# Patient Record
Sex: Female | Born: 2000 | Race: White | Hispanic: No | Marital: Single | Smoking: Never smoker
Health system: Southern US, Community
[De-identification: ages and names within clinical notes are randomized; demographics above are authoritative.]

## PROBLEM LIST (undated history)

## (undated) DIAGNOSIS — Z8669 Personal history of other diseases of the nervous system and sense organs: Secondary | ICD-10-CM

## (undated) DIAGNOSIS — F419 Anxiety disorder, unspecified: Secondary | ICD-10-CM

## (undated) DIAGNOSIS — Z9109 Other allergy status, other than to drugs and biological substances: Secondary | ICD-10-CM

## (undated) HISTORY — PX: OTHER SURGICAL HISTORY: SHX169

## (undated) HISTORY — PX: PANCREATECTOMY: SHX5261

## (undated) HISTORY — DX: Anxiety disorder, unspecified: F41.9

## (undated) HISTORY — DX: Personal history of other diseases of the nervous system and sense organs: Z86.69

---
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
## 2022-01-08 ENCOUNTER — Ambulatory Visit (INDEPENDENT_AMBULATORY_CARE_PROVIDER_SITE_OTHER): Payer: BC Managed Care – PPO

## 2022-01-08 ENCOUNTER — Ambulatory Visit (INDEPENDENT_AMBULATORY_CARE_PROVIDER_SITE_OTHER): Payer: BC Managed Care – PPO | Admitting: Cardiology

## 2022-01-08 ENCOUNTER — Other Ambulatory Visit: Payer: Self-pay

## 2022-01-08 ENCOUNTER — Encounter: Payer: Self-pay | Admitting: Cardiology

## 2022-01-08 VITALS — BP 98/67 | HR 61 | Ht 61.0 in | Wt 126.0 lb

## 2022-01-08 DIAGNOSIS — R55 Syncope and collapse: Secondary | ICD-10-CM

## 2022-01-08 DIAGNOSIS — I951 Orthostatic hypotension: Secondary | ICD-10-CM

## 2022-01-08 NOTE — Patient Instructions (Signed)
Medication Instructions:  ? ?Your physician recommends that you continue on your current medications as directed. Please refer to the Current Medication list given to you today.  ? ?*If you need a refill on your cardiac medications before your next appointment, please call your pharmacy* ? ? ?Lab Work: ?None ordered ? ?If you have labs (blood work) drawn today and your tests are completely normal, you will receive your results only by: ?MyChart Message (if you have MyChart) OR ?A paper copy in the mail ?If you have any lab test that is abnormal or we need to change your treatment, we will call you to review the results. ? ? ?Testing/Procedures: ?Your provider has ordered a heart monitor to wear for 14 days. This will be mailed to your home with instructions on placement. Once you have finished the time frame requested, you will return monitor in box provided. ? ?  ? ? ?Follow-Up: ?At [facility], you and your health needs are our priority.  As part of our continuing mission to provide you with exceptional heart care, we have created designated Provider Care Teams.  These Care Teams include your primary Cardiologist (physician) and Advanced Practice Providers (APPs -  Physician Assistants and Nurse Practitioners) who all work together to provide you with the care you need, when you need it. ? ?We recommend signing up for the patient portal called "MyChart".  Sign up information is provided on this After Visit Summary.  MyChart is used to connect with patients for Virtual Visits (Telemedicine).  Patients are able to view lab/test results, encounter notes, upcoming appointments, etc.  Non-urgent messages can be sent to your provider as well.   ?To learn more about what you can do with MyChart, go to [email or web address].   ? ?Your next appointment:   ?4-6 week(s) ? ?The format for your next appointment:   ?In Person ? ?Provider:   ?You may see Bryan Lemma, MD or one of the following Advanced Practice Providers  on your designated Care Team:   ?Nicolasa Ducking, NP ?Eula Listen, PA-C ?Cadence Fransico Michael, PA-C ? ? ?Other Instructions ?- Please ensure that you are drinking plenty of fluids; aim for 3L a day, with a mix of water and drinks such as Gatorade, Poweraid, etc.  ?- Do core strengthening exercises such as a wall lean for 10 minutes  ?- Compression: ?Lower extremity (up to the knee) ?Thigh Sleeves ?Abdominal Binder ?Spanx ? ?- Salt Supplementation Options: ?1) Pedialyte Advanced Care ?2) Liquid IV ?3) Nuun ?4) Therma Tabs ?5) Vitassium ? ?- Exercise ? ?- Shower at Night ? ?

## 2022-01-08 NOTE — Progress Notes (Signed)
Primary Care Provider: Herb GraysBoylston, Yun, MD [facility] Cardiologist: None Electrophysiologist: None  Clinic Note: Chief Complaint  Patient presents with   New Patient (Initial Visit)    Establish care with provider and medications verbally reviewed with patient.    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  There also may be a component of POTS mainstay of treatment for this is the same.  She has baseline borderline hypotension.  However will check orthostatics today, was relatively normal.  However, if she is dehydrated, or not feeling well, with the start of blood pressure in the high 90s to low 100s, a drop of 10-15 points on standing could easily lead to syncope.  She is already had an echocardiogram done recently and it did not show any structural abnormalities.  No signs of MVP.  She does states she has tachycardia spells and therefore we will check a monitor to confirm or deny presence of any arrhythmias or significant tachycardia.  We will try to avoid AV nodal agents at this point.  Typically, SNRIs tend to be more beneficial in this setting than SSRIs which could potentiate this type of syncope.  She has been switched to Lexapro which should help.  However, we may consider holding if she has frequent episodes.  Plan: Conservative management for now: - Please ensure that you are drinking plenty of fluids; aim for 3L a day, with a mix of water and drinks such as Gatorade, Poweraid, etc.  - Do core strengthening exercises such as a wall lean for 10 minutes  (along with Wall-lean exercise with half squats.) - Compression: Lower extremity (up to the knee) Thigh Sleeves Abdominal Binder Spanx  - Salt Supplementation  Options: 1) Pedialyte Advanced Care 2) Liquid IV 3) Nuun 4) Therma Tabs 5) Vitassium  - Exercise-discussed exercise tense abdominal, buttock, thigh and calf muscles.    - Shower at [employer]      Relevant Orders   EKG 12-Lead (Completed)   LONG TERM MONITOR (3-14 DAYS)    ===================================  HPI:    Lydia Jennings is a 21 y.o. female with q history of syncope and panic attacks (questionable POTS) who is being seen today for the evaluation of recurrent syncope at the request of Herb GraysBoylston, Yun, MD -> in response to recent ER visits..  Recent Hospitalizations:  [facility] ED 12/15/2021: Presented with a syncopal episode at school.  Has been followed by neurology for syncope for some time now.  Appears to be evaluated with echocardiogram in September 2021 with normal findings.  EKG was normal.  Symptoms improved with hydration.  Suspect orthostatic etiology and possibly dehydration as a contributing factor. [facility] ED 01/01/2022: Syncopal episode at school after standing up quickly from tying shoes.  Thought to be orthostatic in nature.  No red flags.  Referred to cardiology for possible POTS-as diagnosed by PCP.  Reviewed  CV studies:    The following studies were reviewed today: (if available, images/films reviewed: From Epic Chart or Care Everywhere) TTE 08/02/2020: Normal right and left ventricles.  Normal valves.  No ASD or VSD   Interval History:   Lydia SeaBreighley Alyssa Klayman presents here for evaluation of recurrent syncope episodes.  Prior to these episodes, she had not had a documented  episode in over a year.  She has had significant positional dizziness and lightheadedness as well as tachycardia spells that are somewhat worrisome to her with at least one recording on her Apple Watch showing that her heart rate 170 bpm.  However she is only really had 2 true syncopal episodes-they occurred too quickly where she was not able to react fast enough to get down..  Otherwise  she just has episodes where she feels somewhat shaky and dizzy but is able to sit down. She has been working with the specialist for her anxiety which could be playing a component of her symptoms.  Initial episode: Per EMS report: She stood up while studying at [employer] at about the syncopal event with loss of consciousness of roughly 5 minutes.  No seizure-like activity.  No trauma.  Followed by hyperventilation after regaining consciousness.  The only symptoms leading up to this episode over the she had had some nausea and vomiting over the weekend and may have been little bit dehydrated.  She denied any sensation of rapid heart rates or palpitations with this episode.  No alcohol or drug use. No chest pain or pressure.No arrhythmias noted. => Suspected to be orthostatic/neurocardiogenic syncope related to dehydration.   => Hydrated and felt better.    Per primary care provider suspected possible POTS and panic attacks.  Second episode later that month: Was bending over to tie her shoes, stood up and passed out.  Exam benign.  Work-up benign.  Hydrated in the ER discharged home with cardiology follow-up.  Encouraged hydration and increased salt intake.   CV Review of Symptoms (Summary) Cardiovascular ROS: no chest pain or dyspnea on exertion positive for - loss of consciousness, palpitations, rapid heart rate, and mild pooling swelling of the feet.  Has had hyperventilation and panic attacks as well. negative for - chest pain, irregular heartbeat, orthopnea, paroxysmal nocturnal dyspnea, shortness of breath, or TIA/amaurosis fugax or claudication  REVIEWED OF SYSTEMS   Review of Systems  Constitutional:  Negative for malaise/fatigue and weight loss.  HENT:  Negative for congestion.   Respiratory:  Negative for cough and shortness of breath.   Cardiovascular:        Per HPI  Gastrointestinal:  Negative for blood in stool and melena.  Genitourinary:  Negative for hematuria.   Musculoskeletal:  Negative for falls (Only with 2 syncopal episodes.;  No trauma).  Neurological:  Positive for dizziness (She does have some positional dizziness, not nearly as severe as the 2 syncopal episodes.Marland Kitchen) and loss of consciousness (Per HPI).  Psychiatric/Behavioral:  The patient is nervous/anxious.    I have reviewed and (if needed) personally updated the patient's problem list, medications, allergies, past medical and surgical history, social and family history.   PAST MEDICAL HISTORY   Past Medical History:  Diagnosis Date   Anxiety    Environmental allergies    Hx of migraines    Recurrent syncope 07/2020   Evaluated, had an echocardiogram.  Did not do tilt table    PAST SURGICAL HISTORY   Past Surgical History:  Procedure Laterality Date   Pancreatic debridement     TRANSTHORACIC ECHOCARDIOGRAM  08/02/2020   Normal right and left ventricles.  Normal valves.  No ASD or VSD    There is no immunization history on file for this patient.  MEDICATIONS/ALLERGIES   Current Meds  Medication Sig   Azelastine-Fluticasone 137-50 MCG/ACT SUSP Place into the nose.   cetirizine (ZYRTEC) 10 MG tablet Take by mouth.  Cholecalciferol 25 MCG (1000 UT) tablet Take by mouth.   desogestrel-ethinyl estradiol (MIRCETTE) 0.15-0.02/0.01 MG (21/5) tablet Take 1 tablet by mouth daily.   EPINEPHrine 0.3 mg/0.3 mL IJ SOAJ injection Inject into the muscle as directed.   escitalopram (LEXAPRO) 10 MG tablet Take 10 mg by mouth daily.   RIBOFLAVIN PO Take by mouth.   rizatriptan (MAXALT) 10 MG tablet Take by mouth.    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   Hopes to go into premed.  Her father is an Risk manager in [place]   Usually relatively active..   Family History  Problem Relation Age of Onset   Migraines Mother    Brain cancer Maternal Grandmother    Cancer Paternal Grandmother     OBJCTIVE -PE, EKG, labs   Wt Readings from Last 3 Encounters:  01/08/22 126 lb (57.2 kg) (50 %, Z= 0.01)*  03/11/16 119 lb 12.8 oz (54.3 kg) (79 %, Z= 0.81)*  03/10/16 120 lb 9.6 oz (54.7 kg) (80 %, Z= 0.83)*   * Growth percentiles are based on CDC (Girls, 2-20 Years) data.    Physical Exam: BP 98/67 (BP Location: Right Arm, Patient Position: Sitting, Cuff Size: Normal)    Pulse 61    Ht 5\' 1"  (1.549 m)    Wt 126 lb (57.2 kg)    SpO2 97%    BMI 23.81 kg/m  Physical Exam Vitals reviewed.  Constitutional:      General: She is not in acute distress.    Appearance: Normal appearance. She is normal weight. She is not ill-appearing or toxic-appearing.  HENT:     Head: Normocephalic and atraumatic.  Neck:     Vascular: No carotid bruit.  Cardiovascular:     Rate and Rhythm: Normal rate and regular rhythm.     Pulses: Normal pulses.     Heart sounds: No murmur heard.   No friction rub. No gallop.  Pulmonary:     Effort: Pulmonary effort is normal. No respiratory distress.     Breath sounds: Normal breath sounds. No wheezing, rhonchi or rales.  Chest:     Chest wall: No tenderness.  Abdominal:     General: Abdomen is flat. Bowel sounds are normal. There is no distension.     Palpations: Abdomen is soft. There is no mass.     Tenderness: There is no abdominal tenderness.     Comments: No HSM or bruit.  Musculoskeletal:        General: No swelling. Normal range of motion.     Cervical back: Normal range of motion and neck supple.  Skin:    General: Skin is warm and dry.     Coloration: Skin is not pale.  Neurological:     General: No focal deficit present.     Mental Status: She is alert and oriented to person, place, and time.     Cranial Nerves: No cranial nerve deficit.     Motor:  No weakness.     Gait: Gait normal.  Psychiatric:        Mood and Affect: Mood normal.        Behavior:  Behavior normal.        Thought Content: Thought content normal.        Judgment: Judgment normal.     Adult ECG Report  Rate: 61 ;  Rhythm: NSR.  Baseline wander but otherwise normal axis vitals durations.;   Narrative Interpretation: Stable  Recent Labs: Reviewed No results found for: CHOL, HDL, LDLCALC, LDLDIRECT, TRIG, CHOLHDL Lab Results  Component Value Date   CREATININE 0.64 03/11/2016   BUN 12 03/11/2016   NA 137 03/11/2016   K 3.7 03/11/2016   CL 102 03/11/2016   CO2 26 03/11/2016   CBC Latest Ref Rng & Units 03/11/2016 08/02/2011  WBC 3.6 - 11.0 K/uL 8.3 21.1(H)  Hemoglobin 12.0 - 16.0 g/dL 65.7 90.3  Hematocrit 83.3 - 45.0 % 37.5 37.3  Platelets 150 - 440 K/uL 301 349    No results found for: HGBA1C No results found for: TSH  ==================================================  COVID-19 Education: The signs and symptoms of COVID-19 were discussed with the patient and how to seek care for testing (follow up with PCP or arrange E-visit).    I spent a total of 24 minutes with the patient spent in direct patient consultation.  Additional time spent with chart review  / charting (studies, outside notes, etc): 22 min Total Time: 46 min  Current medicines are reviewed at length with the patient today.  (+/- concerns) n/a  This visit occurred during the SARS-CoV-2 public health emergency.  Safety protocols were in place, including screening questions prior to the visit, additional usage of staff PPE, and extensive cleaning of exam room while observing appropriate contact time as indicated for disinfecting solutions.  Notice: This dictation was prepared with Dragon dictation along with smart phrase technology. Any transcriptional errors that result from this process are unintentional and may not be corrected upon review.   Studies Ordered:  Orders Placed This  Encounter  Procedures   LONG TERM MONITOR (3-14 DAYS)   EKG 12-Lead    Patient Instructions / Medication Changes & Studies & Tests Ordered   Patient Instructions  Medication Instructions:   Your physician recommends that you continue on your current medications as directed. Please refer to the Current Medication list given to you today.   *If you need a refill on your cardiac medications before your next appointment, please call your pharmacy*   Lab Work: None ordered  If you have labs (blood work) drawn today and your tests are completely normal, you will receive your results only by: MyChart Message (if you have MyChart) OR A paper copy in the mail If you have any lab test that is abnormal or we need to change your treatment, we will call you to review the results.   Testing/Procedures: Your provider has ordered a Zio patch heart monitor to wear for 14 days. This will be mailed to your home with instructions on placement. Once you have finished the time frame requested, you will return monitor in box provided.     Follow-Up: At [facility], you and your health needs are our priority.  As part of our continuing mission to provide you with exceptional heart care, we have created designated Provider Care Teams.  These Care Teams include your primary Cardiologist (physician) and Advanced Practice Providers (APPs -  Physician Assistants and Nurse Practitioners) who all work together to provide you with the care you need, when you need it.  We recommend signing up for the patient portal called "MyChart".  Sign up information is  provided on this After Visit Summary.  MyChart is used to connect with patients for Virtual Visits (Telemedicine).  Patients are able to view lab/test results, encounter notes, upcoming appointments, etc.  Non-urgent messages can be sent to your provider as well.   To learn more about what you can do with MyChart, go to [email or web address].    Your next  appointment:   4-6 week(s)  The format for your next appointment:   In Person  Provider:   You may see Bryan Lemmaavid Maecyn Panning, MD or one of the following Advanced Practice Providers on your designated Care Team:   Nicolasa Duckinghristopher Berge, NP Eula Listenyan Dunn, PA-C Cadence Fransico MichaelFurth, PA-C   Other Instructions - Please ensure that you are drinking plenty of fluids; aim for 3L a day, with a mix of water and drinks such as Gatorade, Poweraid, etc.  - Do core strengthening exercises such as a wall lean for 10 minutes  - Compression: Lower extremity (up to the knee) Thigh Sleeves Abdominal Binder Spanx  - Salt Supplementation Options: 1) Pedialyte Advanced Care 2) Liquid IV 3) Nuun 4) Therma Tabs 5) Vitassium  - Exercise  - Shower at Night     Bryan Lemmaavid Tarsha Blando, M.D., M.S. Interventional Cardiologist   Pager # [phone] Phone # [phone] [address] [place], [place] [address]   Thank you for choosing [facility] in [place]!!

## 2022-01-08 NOTE — Progress Notes (Deleted)
Arrhythmias (not life-threatening) -- Rhythm disturbances that are not ventricular in origin may cause syncope: ??Supraventricular tachycardia - Syncope is an unusual presentation of supraventricular tachycardia. Accessory pathway mediated tachycardia and atrioventricular nodal reentry tachycardia are relatively common arrhythmias, but rarely present with syncope. (See "Clinical features and diagnosis of supraventricular tachycardia (SVT) in children", section on 'Syncope and atrial fibrillation'.) ??Bradycardia - In children, syncope caused by isolated bradycardia is unusual [11]. Causes of symptomatic bradycardia in children are corrective surgery for congenital heart disease, hypervagotonia, hypothyroidism, and medications (such as beta-adrenergic blockers). (See "Bradycardia in children".) ?Postural tachycardia syndrome -- Postural tachycardia syndrome (POTS) is defined as a form of orthostatic intolerance characterized by an excessive increase in heart rate (>40 beats per minute [bpm] over baseline in children and adolescents and >30 bpm or to >120 bpm in adults) that occurs on standing without arterial hypotension. POTS is a common disorder among teenage girls that typically manifests as palpitations, anxiety, dizziness, and tremulousness. However, syncope may occur in up to 40 percent of patients. (See "Postural tachycardia syndrome".) ? ? ?

## 2022-01-10 ENCOUNTER — Encounter: Payer: Self-pay | Admitting: Cardiology

## 2022-01-10 DIAGNOSIS — I951 Orthostatic hypotension: Secondary | ICD-10-CM | POA: Insufficient documentation

## 2022-01-10 NOTE — Assessment & Plan Note (Addendum)
The symptoms described are clearly consistent with orthostatic hypotension.  There also may be a component of POTS mainstay of treatment for this is the same.  She has baseline borderline hypotension.  However will check orthostatics today, was relatively normal.  However, if she is dehydrated, or not feeling well, with the start of blood pressure in the high 90s to low 100s, a drop of 10-15 points on standing could easily lead to syncope. ? ?She is already had an echocardiogram done recently and it did not show any structural abnormalities.  No signs of MVP. ? ?She does states she has tachycardia spells and therefore we will check a monitor to confirm or deny presence of any arrhythmias or significant tachycardia.  We will try to avoid AV nodal agents at this point. ? ?? Typically, SNRIs tend to be more beneficial in this setting than SSRIs which could potentiate this type of syncope.  She has been switched to Lexapro which should help.  However, we may consider holding if she has frequent episodes. ? ?Plan: Conservative management for now: ?- Please ensure that you are drinking plenty of fluids; aim for 3L a day, with a mix of water and drinks such as Gatorade, Poweraid, etc.  ?- Do core strengthening exercises such as a wall lean for 10 minutes  (along with Wall-lean exercise with half squats.) ?- Compression: ?Lower extremity (up to the knee) ?Thigh Sleeves ?Abdominal Binder ?Spanx ? ?- Salt Supplementation Options: ?1) Pedialyte Advanced Care ?2) Liquid IV ?3) Nuun ?4) Therma Tabs ?5) Vitassium ? ?- Exercise-discussed exercise tense abdominal, buttock, thigh and calf muscles.   ? ?- Shower at Night ?

## 2022-01-13 DIAGNOSIS — R55 Syncope and collapse: Secondary | ICD-10-CM

## 2022-02-12 ENCOUNTER — Encounter: Payer: Self-pay | Admitting: Cardiology

## 2022-02-12 ENCOUNTER — Ambulatory Visit (INDEPENDENT_AMBULATORY_CARE_PROVIDER_SITE_OTHER): Payer: BC Managed Care – PPO | Admitting: Cardiology

## 2022-02-12 DIAGNOSIS — I951 Orthostatic hypotension: Secondary | ICD-10-CM

## 2022-02-12 MED ORDER — MIDODRINE HCL 2.5 MG PO TABS
2.5000 mg | ORAL_TABLET | Freq: Two times a day (BID) | ORAL | 3 refills | Status: DC
Start: 2022-02-12 — End: 2022-07-17

## 2022-02-12 NOTE — Progress Notes (Signed)
? ? ?Primary Care Provider: Herb GraysBoylston, Yun, MD ?Cardiologist: None ?Electrophysiologist: None ? ?Clinic Note: ?Chief Complaint  ?Patient presents with  ? Follow-up  ?  Discussed results of monitor  ? ?=================================== ? ?ASSESSMENT/PLAN  ? ?Problem List Items Addressed This Visit   ? ? 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  Baseline hypotension probably exacerbated by orthostatic hypotension.  There may or may not be a component of POTS.  Difficult because the true definition of POTS would mean that her resting heart rate is higher than an average of 72 bpm. ? ?Her symptoms however were noted with heart rates in the high 90s to low 100s.  She was asymptomatic with sinus tachycardia in the 130s to 170s. ? ?Plan: ?Continue to encourage aggressive hydration but also increased nutrition to ensure that hydration is fully absorbed. ?We will start low-dose midodrine to help baseline hypotension. ->  2.5 mg at least in the morning, and still dizzy in the afternoon, okay to take a second dose. ?  ?  ? ?=================================== ? ?HPI:   ? 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 ? ?Lydia Jennings was seen on January 08, 2022 for evaluation of episodic syncope.  Has had recurrent episodes leading up to the visit.  Prior to that had not had one in over a year.  Notes positional dizziness and lightheadedness as well as tachycardia spells.  1 occurrence her Apple Watch recorded a heart rate of 170 bpm at rest.  During the 2 episodes of syncope leading up to the visit she was not able to capture rhythm because of the have to fast.  She feels shaky and dizzy and usually is able to sit down before passing out, but the last 2 occasions it happened to pass. ? ?Recent  Hospitalizations: None ? ?Reviewed  CV studies:   ? ?The following studies were reviewed today: (if available, images/films reviewed: From Epic Chart or Care Everywhere) ?Zio Patch: Patient had a min HR of 50 bpm, max HR of 170 bpm, and avg HR of 72 bpm. Predominant underlying rhythm was Sinus Rhythm. Ectopic Atrial Rhythm was present. Ectopic Atrial Rhythm was detected within +/- 45 seconds of symptomatic patient event(s). Isolated SVEs were rare (<1.0%, 19), and no SVE Couplets or SVE Triplets were present. Isolated VEs were rare (<1.0%, 26), and no VE Couplets or VE Triplets were present. ? ?Interval History:  ? ?Lydia Jennings returns today this time with her mother.  She has still had tachycardic spells but has not had any further syncope.  She is also borderline hypotensive at baseline. ? ?Still has some fast heart rate spells.  We reviewed the results of her monitor indicating there were no arrhythmias.  Also very difficult to determine if this is true POTS because her resting heart rate averages 70 bpm. ? ?CV Review of Symptoms (Summary) ?Cardiovascular ROS: no chest pain or dyspnea on exertion ?positive for - palpitations, rapid heart rate, and near syncope, but no recurrent syncope ?negative for - edema, irregular heartbeat, orthopnea, paroxysmal nocturnal dyspnea, shortness of breath, or TIA/amaurosis fugax, claudication, ? ?REVIEWED OF SYSTEMS  ? ?Review of Systems  ?Constitutional:  Negative for malaise/fatigue and weight loss.  ?HENT:  Negative for congestion and nosebleeds.   ?Respiratory:  Negative for cough and shortness of breath.   ?  Cardiovascular:   ?     Per HPI  ?Neurological:  Positive for dizziness. Negative for focal weakness and weakness.  ?Psychiatric/Behavioral:  Negative for depression. The patient is nervous/anxious.   ? ?I have reviewed and (if needed) personally updated the patient's problem list, medications, allergies, past medical and surgical history, social and family  history.  ? ?PAST MEDICAL HISTORY  ? ?Past Medical History:  ?Diagnosis Date  ? Anxiety   ? Environmental allergies   ? Hx of migraines   ? Recurrent syncope 07/2020  ? Evaluated, had an echocardiogram.  Did not do tilt table  ? ? ?PAST SURGICAL HISTORY  ? ?Past Surgical History:  ?Procedure Laterality Date  ? Pancreatic debridement    ? TRANSTHORACIC ECHOCARDIOGRAM  08/02/2020  ? Normal right and left ventricles.  Normal valves.  No ASD or VSD  ? ? ?There is no immunization history on file for this patient. ? ?MEDICATIONS/ALLERGIES  ? ?Current Meds  ?Medication Sig  ? acetaminophen (TYLENOL) 500 MG tablet Take 500 mg by mouth as needed for headache.  ? Azelastine-Fluticasone 137-50 MCG/ACT SUSP Place into the nose.  ? cetirizine (ZYRTEC) 10 MG tablet Take by mouth.  ? Cholecalciferol 25 MCG (1000 UT) tablet Take by mouth.  ? desogestrel-ethinyl estradiol (MIRCETTE) 0.15-0.02/0.01 MG (21/5) tablet Take 1 tablet by mouth daily.  ? EPINEPHrine 0.3 mg/0.3 mL IJ SOAJ injection Inject into the muscle as directed.  ? escitalopram (LEXAPRO) 10 MG tablet Take 10 mg by mouth daily.  ? MAGNESIUM PO Take by mouth daily.  ? midodrine (PROAMATINE) 2.5 MG tablet Take 1 tablet (2.5 mg total) by mouth 2 (two) times daily with a meal.  ? Naproxen Sodium (ALEVE PO) Take by mouth as needed (headaches).  ? Omega-3 Fatty Acids (FISH OIL PO) Take by mouth daily.  ? RIBOFLAVIN PO Take by mouth.  ? rizatriptan (MAXALT) 10 MG tablet Take by mouth as needed for migraine.  ? ? ?Allergies  ?Allergen Reactions  ? Amoxicillin Hives  ? ? ?SOCIAL HISTORY/FAMILY HISTORY  ? ?Reviewed in Epic:  ?Pertinent findings:  ?Social History  ? ?Tobacco Use  ? Smoking status: Never  ?Substance Use Topics  ? Alcohol use: No  ? Drug use: No  ? ?Social History  ? ?Social History Narrative  ? 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  ? Hopes to go into premed.  Her father is an IT sales professional in [place]  ? Usually relatively active..  ? ? ?OBJCTIVE -PE,  EKG, labs  ? ?Wt Readings from Last 3 Encounters:  ?02/12/22 129 lb 12.8 oz (58.9 kg) (57 %, Z= 0.17)*  ?01/08/22 126 lb (57.2 kg) (50 %, Z= 0.01)*  ?03/11/16 119 lb 12.8 oz (54.3 kg) (79 %, Z= 0.81)*  ? ?* Growth percentiles are based on CDC (Girls, 2-20 Years) data.  ? ? ?Physical Exam: ?BP 96/82   Pulse 72   Ht 5\' 1"  (1.549 m)   Wt 129 lb 12.8 oz (58.9 kg)   LMP 01/27/2022   SpO2 99%   BMI 24.53 kg/m?  ?Physical Exam ?Vitals reviewed.  ?Constitutional:   ?   Appearance: Normal appearance. She is normal weight.  ?HENT:  ?   Head: Normocephalic and atraumatic.  ?Neck:  ?   Vascular: No carotid bruit.  ?Cardiovascular:  ?   Rate and Rhythm: Normal rate and regular rhythm.  ?   Pulses: Normal pulses.  ?   Heart sounds: Normal heart sounds. No murmur heard. ?  No friction rub. No gallop.  ?Pulmonary:  ?   Effort: Pulmonary effort is normal. No respiratory distress.  ?   Breath sounds: Normal breath sounds. No wheezing, rhonchi or rales.  ?Musculoskeletal:     ?   General: No swelling. Normal range of motion.  ?   Cervical back: Normal range of motion and neck supple.  ?Skin: ?   General: Skin is warm and dry.  ?Neurological:  ?   General: No focal deficit present.  ?   Mental Status: She is alert and oriented to person, place, and time.  ?Psychiatric:     ?   Mood and Affect: Mood normal.     ?   Behavior: Behavior normal.     ?   Thought Content: Thought content normal.     ?   Judgment: Judgment normal.  ? ? ? Adult ECG Report ?N/A ? ?Recent Labs:  N/A  ?No results found for: CHOL, HDL, LDLCALC, LDLDIRECT, TRIG, CHOLHDL ?Lab Results  ?Component Value Date  ? CREATININE 0.64 03/11/2016  ? BUN 12 03/11/2016  ? NA 137 03/11/2016  ? K 3.7 03/11/2016  ? CL 102 03/11/2016  ? CO2 26 03/11/2016  ? ? ?  Latest Ref Rng & Units 03/11/2016  ?  3:54 PM 08/02/2011  ?  6:55 PM  ?CBC  ?WBC 3.6 - 11.0 K/uL 8.3   21.1    ?Hemoglobin 12.0 - 16.0 g/dL 08.6   76.1    ?Hematocrit 35.0 - 45.0 % 37.5   37.3    ?Platelets 150 - 440 K/uL  301   349    ? ? ?No results found for: HGBA1C ?No results found for: TSH ? ?================================================== ? ?COVID-19 Education: ?The signs and symptoms of COVID-19 were discussed with the patient and h

## 2022-02-12 NOTE — Patient Instructions (Signed)
Medication Instructions:  ?Your physician has recommended you make the following change in your medication:  ? ?START midodrine (Proamatine) 2.5 mg 1-2 times daily  ? ?*If you need a refill on your cardiac medications before your next appointment, please call your pharmacy* ? ? ?Lab Work: ?None ordered ? ?If you have labs (blood work) drawn today and your tests are completely normal, you will receive your results only by: ?MyChart Message (if you have MyChart) OR ?A paper copy in the mail ?If you have any lab test that is abnormal or we need to change your treatment, we will call you to review the results. ? ? ?Testing/Procedures: ?None ordered ? ? ?Follow-Up: ?At [facility], you and your health needs are our priority.  As part of our continuing mission to provide you with exceptional heart care, we have created designated Provider Care Teams.  These Care Teams include your primary Cardiologist (physician) and Advanced Practice Providers (APPs -  Physician Assistants and Nurse Practitioners) who all work together to provide you with the care you need, when you need it. ? ?We recommend signing up for the patient portal called "MyChart".  Sign up information is provided on this After Visit Summary.  MyChart is used to connect with patients for Virtual Visits (Telemedicine).  Patients are able to view lab/test results, encounter notes, upcoming appointments, etc.  Non-urgent messages can be sent to your provider as well.   ?To learn more about what you can do with MyChart, go to [email or web address].   ? ?Your next appointment:   ?3 month(s) ? ?The format for your next appointment:   ?In Person ? ?Provider:   ?You may see Bryan Lemma, MD or one of the following Advanced Practice Providers on your designated Care Team:   ?Nicolasa Ducking, NP ?Eula Listen, PA-C ?Cadence Fransico Michael, PA-C ? ?Other Instructions ?N/A ?

## 2022-03-22 ENCOUNTER — Encounter: Payer: Self-pay | Admitting: Cardiology

## 2022-03-22 NOTE — Assessment & Plan Note (Signed)
Baseline hypotension probably exacerbated by orthostatic hypotension.  There may or may not be a component of POTS.  Difficult because the true definition of POTS would mean that her resting heart rate is higher than an average of 72 bpm. ? ?Her symptoms however were noted with heart rates in the high 90s to low 100s.  She was asymptomatic with sinus tachycardia in the 130s to 170s. ? ?Plan: ?? Continue to encourage aggressive hydration but also increased nutrition to ensure that hydration is fully absorbed. ?? We will start low-dose midodrine to help baseline hypotension. ->  2.5 mg at least in the morning, and still dizzy in the afternoon, okay to take a second dose. ?

## 2022-04-29 IMAGING — MR MR HEAD WO/W CM
14 series · 48 of 48 positions shown · IV contrast (5ml Gadavist)
Comparison: None.

CLINICAL DATA: 19-year-old female with chronic but progressive
headaches/migraines over the past 2 months. Now with dizziness and
light sensitivity. At least 2 episodes of near-syncope.

EXAM:
MRI HEAD WITHOUT AND WITH CONTRAST
TECHNIQUE: Multiplanar, multiecho pulse sequences of the brain and surrounding
structures were obtained without and with intravenous contrast.
CONTRAST:  5mL GADAVIST GADOBUTROL 1 MMOL/ML IV SOLN

[Series 5: ax dwi_tracew · axial · 3.0mm · 0.60mm/px · z∈[-143,-0]mm · 2 of 46 slices shown]
[im 1/46]
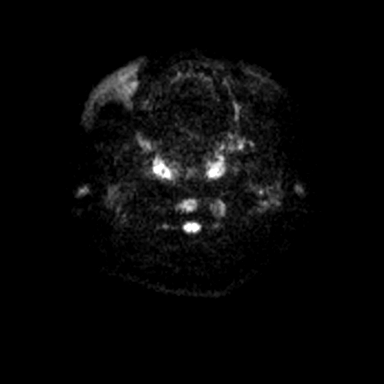
[im 46/46]
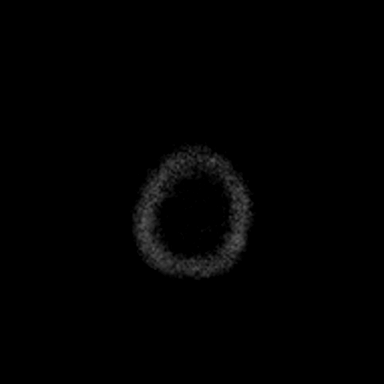

[Series 6: ax dwi_adc · axial · 3.0mm · 0.60mm/px · z∈[-143,-0]mm · 2 of 46 slices shown]
[im 1/46]
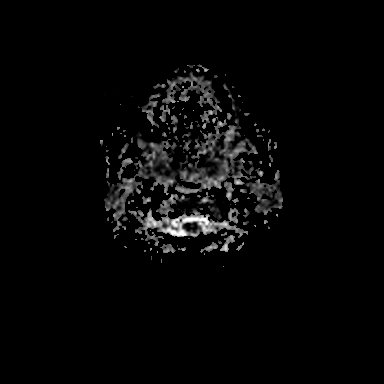
[im 46/46]
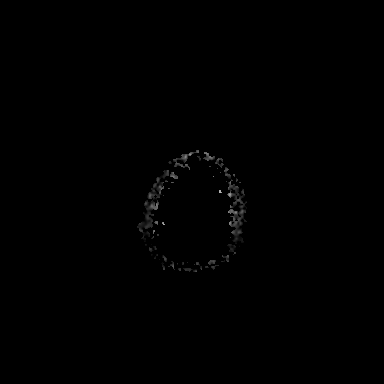

[Series 7: cor dwi_tracew · coronal · 5.0mm · 0.60mm/px · 2 of 34 slices shown]
[im 1/34]
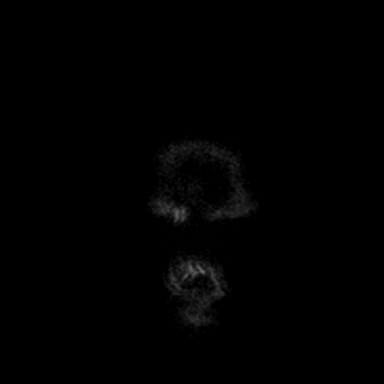
[im 34/34]
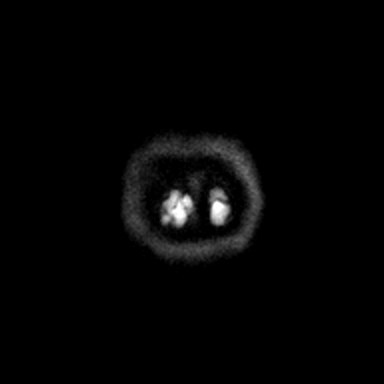

[Series 8: cor dwi_adc · coronal · 5.0mm · 0.60mm/px · 2 of 34 slices shown]
[im 1/34]
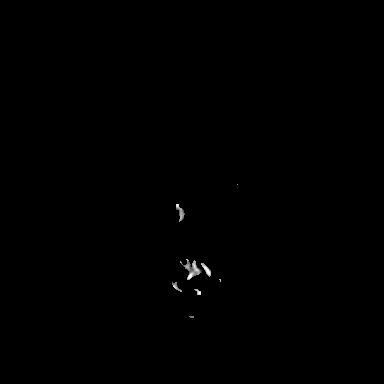
[im 34/34]
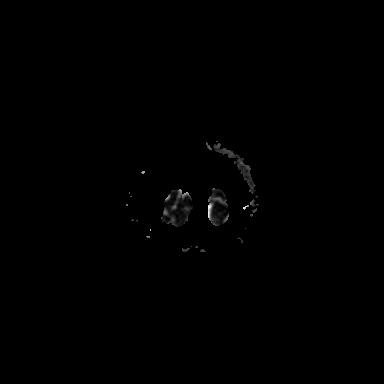

[Series 9: T1 · sagittal · 5.0mm · 0.62mm/px · 1 of 20 slices shown (1 of 2)]
[im 1/20]
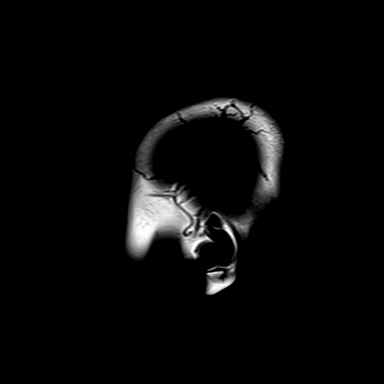

[Series 10: T2 · axial · 5.0mm · 0.53mm/px · z∈[-141,-2]mm · 2 of 25 slices shown]
[im 1/25]
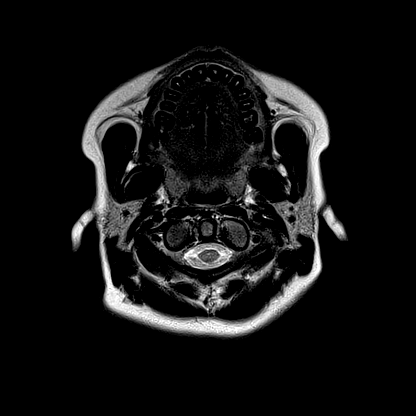
[im 25/25]
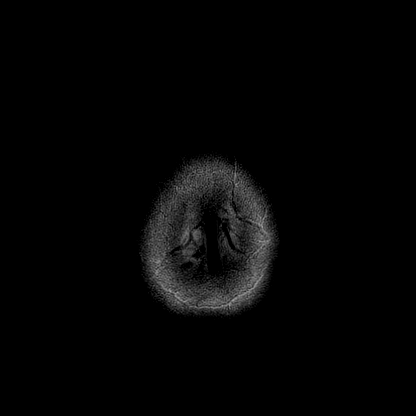

[Series 11: mag_images · axial · 3.0mm · 0.90mm/px · z∈[-157,+13]mm · 4 of 60 slices shown]
[im 1/60]
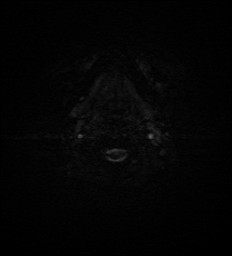
[im 20/60]
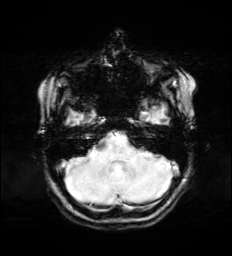
[im 40/60]
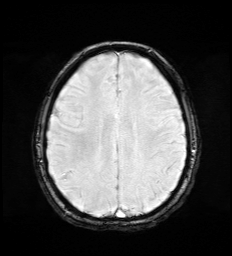
[im 60/60]
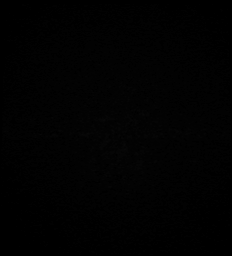

[Series 12: pha_images · axial · 3.0mm · 0.90mm/px · z∈[-157,+13]mm · 4 of 58 slices shown]
[im 1/58]
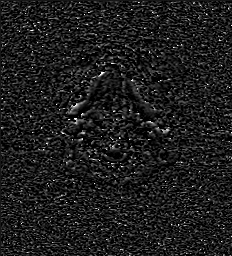
[im 20/58]
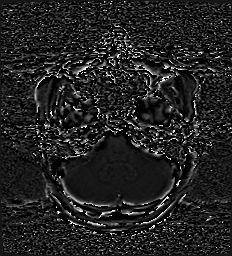
[im 39/58]
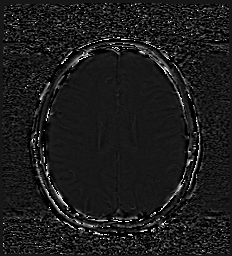
[im 58/58]
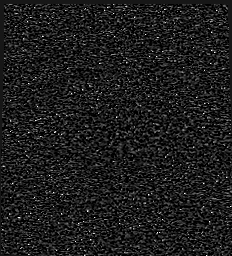

[Series 13: swi_images · axial · 3.0mm · 0.90mm/px · z∈[-157,+13]mm · 4 of 60 slices shown]
[im 1/60]
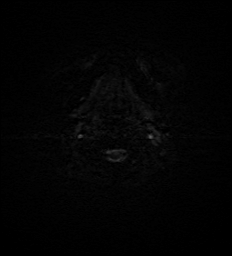
[im 20/60]
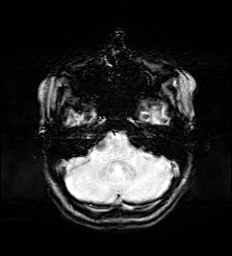
[im 40/60]
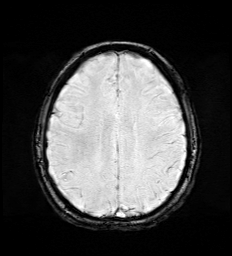
[im 60/60]
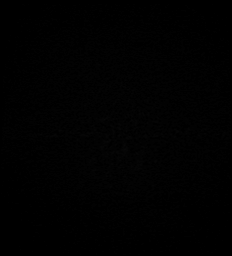

[Series 15: FLAIR · axial · 3.0mm · 0.53mm/px · z∈[-149,+7]mm · 3 of 55 slices shown]
[im 1/55]
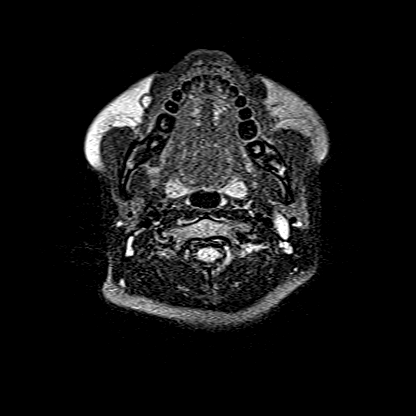
[im 28/55]
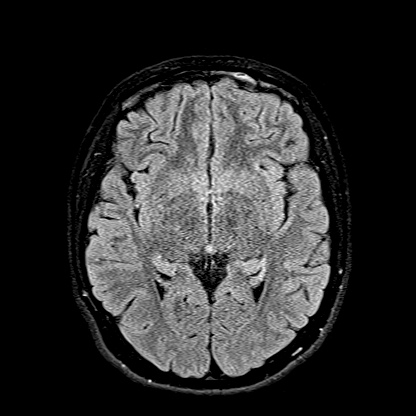
[im 55/55]
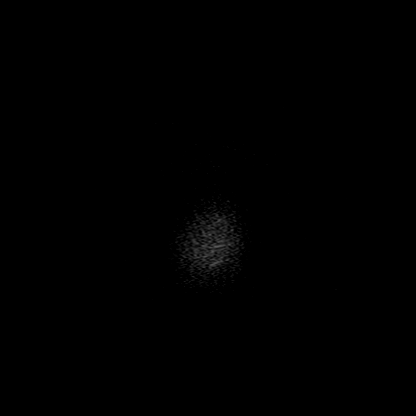

[Series 16: T1 · axial · 1.0mm · 0.98mm/px · z∈[-144,-6]mm · 9 of 144 slices shown (2 of 2)]
[im 1/144]
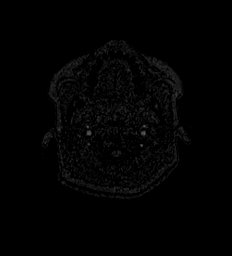
[im 18/144]
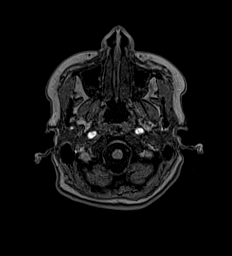
[im 36/144]
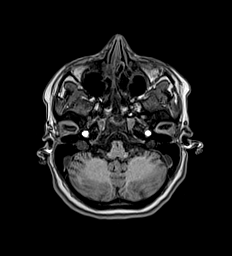
[im 54/144]
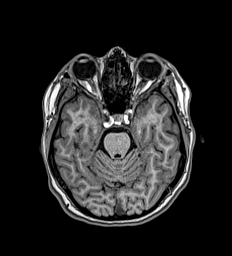
[im 72/144]
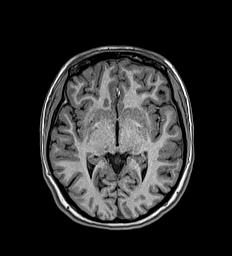
[im 90/144]
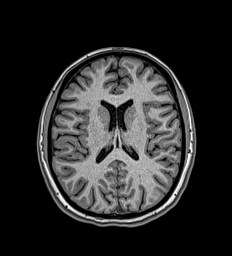
[im 108/144]
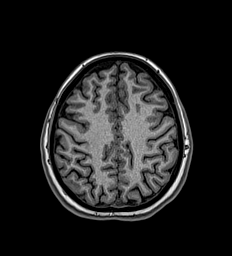
[im 126/144]
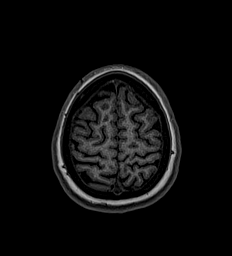
[im 144/144]
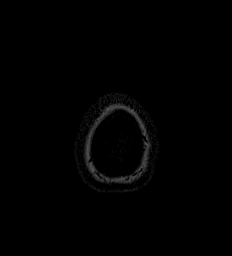

[Series 17: T2 post-contrast · coronal · 5.0mm · 0.57mm/px · 2 of 27 slices shown]
[im 1/27]
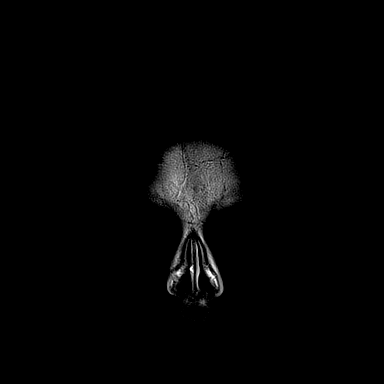
[im 27/27]
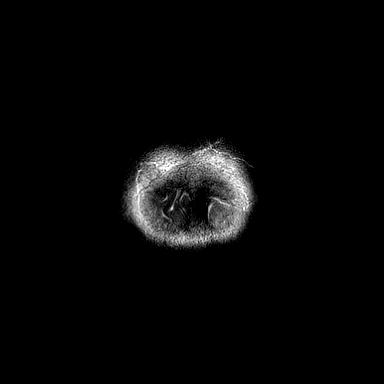

[Series 18: T1 post-contrast · axial · 1.0mm · 0.98mm/px · z∈[-144,-6]mm · 9 of 144 slices shown (1 of 2)]
[im 1/144]
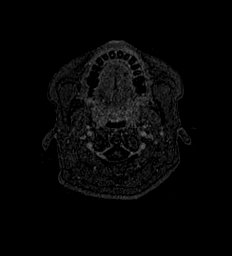
[im 18/144]
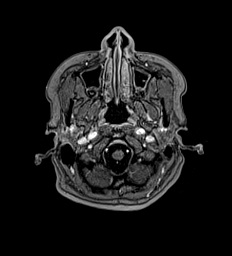
[im 36/144]
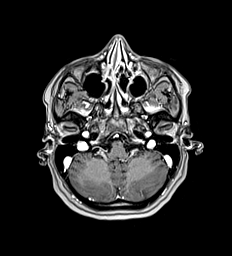
[im 54/144]
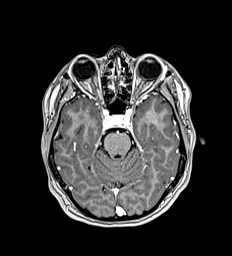
[im 72/144]
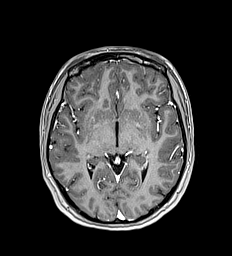
[im 90/144]
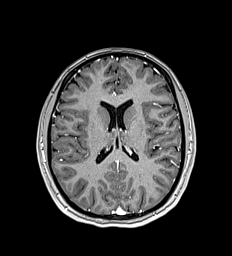
[im 108/144]
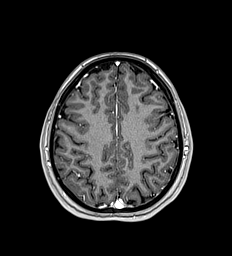
[im 126/144]
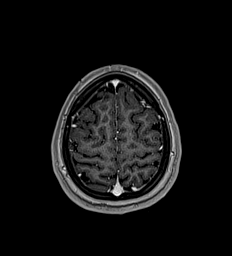
[im 144/144]
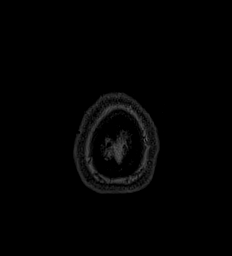

[Series 19: T1 post-contrast · coronal · 5.0mm · 0.57mm/px · 2 of 27 slices shown (2 of 2)]
[im 1/27]
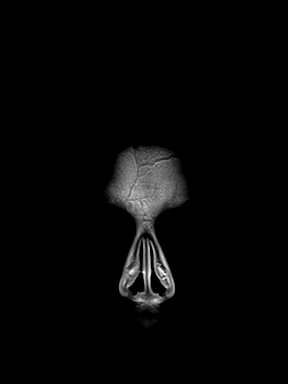
[im 27/27]
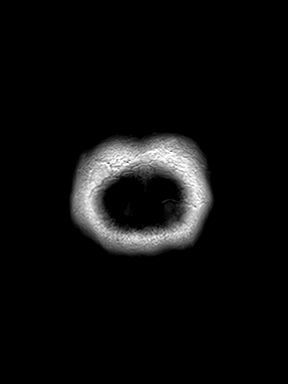

[48 of 48 positions shown; findings below may reference images not displayed]

FINDINGS: Brain: Cerebral volume is within normal limits. No restricted
diffusion to suggest acute infarction. No midline shift, mass
effect, evidence of mass lesion, ventriculomegaly, extra-axial
collection or acute intracranial hemorrhage. Cervicomedullary
junction and pituitary are within normal limits.

Gray and white matter signal is within normal limits. No convincing
encephalomalacia. No chronic cerebral blood products. No abnormal
enhancement identified. No dural thickening.

Vascular: Major Major intracranial vascular flow voids are
preserved. The major dural venous sinuses are enhancing and appear
to be patent.

Skull and upper cervical spine: Negative. Visualized bone marrow
signal is within normal limits.

Sinuses/Orbits: Orbits appear negative. There is scattered bilateral
paranasal sinus mucosal thickening, moderate in the ethmoids. No
sinus fluid level.

Other: Mastoids are clear. Visible internal auditory structures
appear normal. Normal stylomastoid foramina. Negative visible scalp
and face soft tissues.
IMPRESSION: 1. Normal MRI appearance of the brain.
2. Mild to moderate paranasal sinus inflammation, most pronounced in
the ethmoids. Considering new or increased headaches, treatment of
the sinus disease and/or follow-up with ENT may be valuable.

## 2022-05-13 NOTE — Progress Notes (Signed)
Primary Care Provider: Herb Grays, MD Cardiologist: None Electrophysiologist: None  Clinic Note: Chief Complaint  Patient presents with   Follow-up    3 month follow up. Notices frequent POTs episodes. BP is 5-10 points higher.  Meds reviewed with patient.    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  She has baseline hypotension and borderline baseline tachycardia. I would be reluctant to put her on any beta-blocker that may just for tachycardia, but could consider Corlanor if she were to have more issues. We have used midodrine, but she does not seem to be doing well with the second dose.  Try to have her take 2 doses of midodrine in the morning and use another 1 as needed.  This way she is not having the adverse effect in the evening. She can try doing both increasing the dose to 5 mg in the morning and not taking the evening.  She can check how she does with versus without using midodrine and only using as needed.  Maintain adequate hydration, liberalize salt. Try to avoid stress trigger triggers  If symptoms continue to persist, may need to consider things like support stockings, and even salt tablets.  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  Lydia Jennings was last seen on February 12, 2022 to follow-up a Zio patch monitor which was relatively benign.  She was still having some tachycardic spells but not as frequent.  Borderline hypotensive at baseline.  Still noted some  tachycardic episodes but the monitor did not suggest arrhythmia.  Resting heart rate in the 70s would argue against POTS (especially Syndrome of Inappropriate Tachycardia).  Noted dizziness, anxiousness.  Recent Hospitalizations: None  Reviewed  CV studies:    The following studies were reviewed today: (if available, images/films reviewed: From Epic Chart or Care Everywhere) None:  Interval History:   Lydia Jennings turns today for follow-up.  Apparently she did have another episode of syncope during church last week.  She started feeling unusual, leg started shaking and then she passed out. Otherwise, she seems to be able to control episodes more so than she had before.  She is had some weird sensations with taking the midodrine does note that her blood pressure overall is a little higher than it had been. She still has these episodes where she feels her heart rate going up but has not had any additional syncopal episodes other than the one described above.  She still has these episodes, but she is able to "head them off" on occasions by sitting drinking something and try to get herself away from environment.  She also says that she is able to recover a lot quicker with increased hydration.  She is drinking plenty water.  She has been having issues also with her anxiety and depression and type symptoms has been weaned off of Lexapro and is now on combination of Effexor in the morning and trazodone at night to help her sleep.  She does attribute some of her weird  episodes of being stressed.  Still denies any chest pain/pressure or dyspnea with rest or exertion.  No PND orthopnea.  Even though she has near syncopal episodes, she really denies significant palpitations, but does notice that her heart rate going fast.   REVIEWED OF SYSTEMS   Review of Systems  Constitutional:  Negative for malaise/fatigue and weight loss.  Cardiovascular:  Negative for leg swelling.  Musculoskeletal:   Negative for falls (Following the 1 time she had a syncopal episode.) and joint pain.  Neurological:  Positive for dizziness, weakness (Sometimes associated with her "POTS episodes ") and headaches.  Psychiatric/Behavioral:  Positive for depression. The patient is nervous/anxious.     I have reviewed and (if needed) personally updated the patient's problem list, medications, allergies, past medical and surgical history, social and family history.   PAST MEDICAL HISTORY   Past Medical History:  Diagnosis Date   Anxiety    Environmental allergies    Hx of migraines    Recurrent syncope 07/2020   Evaluated, had an echocardiogram.  Did not do tilt table    PAST SURGICAL HISTORY   Past Surgical History:  Procedure Laterality Date   Pancreatic debridement     TRANSTHORACIC ECHOCARDIOGRAM  08/02/2020   Normal right and left ventricles.  Normal valves.  No ASD or VSD    There is no immunization history on file for this patient.  MEDICATIONS/ALLERGIES   Current Meds  Medication Sig   Azelastine-Fluticasone 137-50 MCG/ACT SUSP Place into the nose.   cetirizine (ZYRTEC) 10 MG tablet Take by mouth.   Cholecalciferol 25 MCG (1000 UT) tablet Take by mouth.   cyanocobalamin (,VITAMIN B-12,) 1000 MCG/ML injection Inject into the muscle. Every two weeks   Cyanocobalamin (B-12) 100 MCG TABS Take 100 mcg by mouth daily.   desogestrel-ethinyl estradiol (MIRCETTE) 0.15-0.02/0.01 MG (21/5) tablet Take 1 tablet by mouth daily.   EPINEPHrine 0.3 mg/0.3 mL IJ SOAJ injection Inject into the muscle as directed.   MAGNESIUM PO Take by mouth daily.   midodrine (PROAMATINE) 2.5 MG tablet Take 1 tablet (2.5 mg total) by mouth 2 (two) times daily with a meal.   Naproxen Sodium (ALEVE PO) Take by mouth as needed (headaches).   Omega-3 Fatty Acids (FISH OIL PO) Take by mouth daily.   RIBOFLAVIN PO Take by mouth.   rizatriptan (MAXALT) 10 MG tablet Take by mouth as needed for migraine.   traZODone  (DESYREL) 50 MG tablet Take 50 mg by mouth at bedtime.   venlafaxine XR (EFFEXOR-XR) 75 MG 24 hr capsule Take 75 mg by mouth every morning.    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   Hopes to go into premed.  Her father is an IT sales professional in [place]   Usually relatively active.Bobbie Stack -PE, EKG, labs   Wt Readings from Last 3 Encounters:  05/14/22 142 lb 6.4 oz (64.6 kg) (74 %, Z= 0.65)*  02/12/22 129 lb 12.8 oz (58.9 kg) (57 %, Z= 0.17)*  01/08/22 126 lb (57.2 kg) (50 %, Z= 0.01)*   * Growth percentiles are based on CDC (Girls, 2-20 Years) data.  Physical Exam: BP 102/74 (BP Location: Left Arm, Patient Position: Sitting, Cuff Size: Normal)   Pulse 93   Ht 5\' 1"  (1.549 m)   Wt 142 lb 6.4 oz (64.6 kg)   SpO2 99%   BMI 26.91 kg/m  Physical Exam Constitutional:      General: She is not in acute distress.    Appearance: Normal appearance. She is normal weight. She is not ill-appearing or toxic-appearing.     Comments: Well-nourished and well-groomed  HENT:     Head: Normocephalic and atraumatic.  Neck:     Vascular: No carotid bruit.  Cardiovascular:     Rate and Rhythm: Normal rate and regular rhythm.     Pulses: Normal pulses.     Heart sounds: Normal heart sounds. No murmur heard.    No friction rub. No gallop.  Pulmonary:     Effort: Pulmonary effort is normal. No respiratory distress.     Breath sounds: Normal breath sounds. No wheezing, rhonchi or rales.  Chest:     Chest wall: No tenderness.  Musculoskeletal:        General: No swelling. Normal range of motion.     Cervical back: Normal range of motion and neck supple.  Skin:    Coloration: Skin is  not jaundiced or pale.  Neurological:     General: No focal deficit present.     Mental Status: She is alert and oriented to person, place, and time. Mental status is at baseline.     Gait: Gait normal.  Psychiatric:        Mood and Affect: Mood normal.        Behavior: Behavior normal.        Thought Content: Thought content normal.        Judgment: Judgment normal.     Adult ECG Report N/a  Recent Labs: No recent labs No results found for: "CHOL", "HDL", "LDLCALC", "LDLDIRECT", "TRIG", "CHOLHDL" Lab Results  Component Value Date   CREATININE 0.64 03/11/2016   BUN 12 03/11/2016   NA 137 03/11/2016   K 3.7 03/11/2016   CL 102 03/11/2016   CO2 26 03/11/2016      Latest Ref Rng & Units 03/11/2016    3:54 PM 08/02/2011    6:55 PM  CBC  WBC 3.6 - 11.0 K/uL 8.3  21.1   Hemoglobin 12.0 - 16.0 g/dL 08/04/2011  74.2   Hematocrit 35.0 - 45.0 % 37.5  37.3   Platelets 150 - 440 K/uL 301  349     No results found for: "HGBA1C" No results found for: "TSH"  ================================================== I spent a total of 22 minutes with the patient spent in direct patient consultation.  Additional time spent with chart review  / charting (studies, outside notes, etc): 10 min Total Time: 32 min  Current medicines are reviewed at length with the patient today.  (+/- concerns) n/a   Notice: This dictation was prepared with Dragon dictation along with smart phrase technology. Any transcriptional errors that result from this process are unintentional and may not be corrected upon review.  Studies Ordered:  No orders of the defined types were placed in this encounter.  No orders of the defined types were placed in this encounter.   Patient Instructions / Medication Changes & Studies & Tests Ordered   Patient Instructions  Medication Instructions:  Your physician has recommended you make the following change in your medication:   START taking midodrine 2.5 mg in the morning and  afternoon.   After 2 weeks let us know how that is working.   *If you need a refill on your cardiac medications before your next appointment, please call your pharmacy*   Lab Work: None ordered  If you have labs (blood work) drawn today and your tests are completely normal, you will receive your results only by: MyChart Message (if you have MyChart) OR A paper copy in the mail If you have any lab test that is abnormal or we need to change your treatment, we will call you to review the results.   Testing/Procedures: None ordered   Follow-Up: At [facility], you and your health needs are our priority.  As part of our continuing mission to provide you with exceptional heart care, we have created designated Provider Care Teams.  These Care Teams include your primary Cardiologist (physician) and Advanced Practice Providers (APPs -  Physician Assistants and Nurse Practitioners) who all work together to provide you with the care you need, when you need it.  We recommend signing up for the patient portal called "MyChart".  Sign up information is provided on this After Visit Summary.  MyChart is used to connect with patients for Virtual Visits (Telemedicine).  Patients are able to view lab/test results, encounter notes, upcoming appointments, etc.  Non-urgent messages can be sent to your provider as well.   To learn more about what you can do with MyChart, go to [email or web address].    Your next appointment:   1-2 month(s)  The format for your next appointment:   In Person  Provider:   You may see Bryan Lemma, MD or one of the following Advanced Practice Providers on your designated Care Team:   Nicolasa Ducking, NP Eula Listen, PA-C Cadence Fransico Michael, [place]   Other Instructions N/A  Important Information About Sugar            Bryan Lemma, M.D., M.S. Interventional Cardiologist   Pager # [phone] Phone # [phone] [address] [place], [place] [address]   Thank you for choosing [facility] [place]!!

## 2022-05-14 ENCOUNTER — Ambulatory Visit (INDEPENDENT_AMBULATORY_CARE_PROVIDER_SITE_OTHER): Payer: BC Managed Care – PPO | Admitting: Cardiology

## 2022-05-14 ENCOUNTER — Encounter: Payer: Self-pay | Admitting: Cardiology

## 2022-05-14 VITALS — BP 102/74 | HR 93 | Ht 61.0 in | Wt 142.4 lb

## 2022-05-14 DIAGNOSIS — I951 Orthostatic hypotension: Secondary | ICD-10-CM | POA: Diagnosis not present

## 2022-05-14 NOTE — Patient Instructions (Signed)
Medication Instructions:  Your physician has recommended you make the following change in your medication:   START taking midodrine 2.5 mg in the morning and afternoon.   After 2 weeks let us know how that is working.   *If you need a refill on your cardiac medications before your next appointment, please call your pharmacy*   Lab Work: None ordered  If you have labs (blood work) drawn today and your tests are completely normal, you will receive your results only by: MyChart Message (if you have MyChart) OR A paper copy in the mail If you have any lab test that is abnormal or we need to change your treatment, we will call you to review the results.   Testing/Procedures: None ordered   Follow-Up: At [facility], you and your health needs are our priority.  As part of our continuing mission to provide you with exceptional heart care, we have created designated Provider Care Teams.  These Care Teams include your primary Cardiologist (physician) and Advanced Practice Providers (APPs -  Physician Assistants and Nurse Practitioners) who all work together to provide you with the care you need, when you need it.  We recommend signing up for the patient portal called "MyChart".  Sign up information is provided on this After Visit Summary.  MyChart is used to connect with patients for Virtual Visits (Telemedicine).  Patients are able to view lab/test results, encounter notes, upcoming appointments, etc.  Non-urgent messages can be sent to your provider as well.   To learn more about what you can do with MyChart, go to [email or web address].    Your next appointment:   1-2 month(s)  The format for your next appointment:   In Person  Provider:   You may see Bryan Lemma, MD or one of the following Advanced Practice Providers on your designated Care Team:   Nicolasa Ducking, NP Eula Listen, PA-C Cadence Fransico Michael, [place]   Other Instructions N/A  Important Information About  Sugar

## 2022-05-31 NOTE — Assessment & Plan Note (Addendum)
Is hard to say if she truly has POTS or not.  She has baseline hypotension and borderline baseline tachycardia. I would be reluctant to put her on any beta-blocker that may just for tachycardia, but could consider Corlanor if she were to have more issues. We have used midodrine, but she does not seem to be doing well with the second dose.  Try to have her take 2 doses of midodrine in the morning and use another 1 as needed.  This way she is not having the adverse effect in the evening. She can try doing both increasing the dose to 5 mg in the morning and not taking the evening.  She can check how she does with versus without using midodrine and only using as needed.  Maintain adequate hydration, liberalize salt. Try to avoid stress trigger triggers  If symptoms continue to persist, may need to consider things like support stockings, and even salt tablets.  Would prefer not to consider Florinef

## 2022-07-16 ENCOUNTER — Ambulatory Visit: Payer: BC Managed Care – PPO | Admitting: Cardiology

## 2022-07-16 ENCOUNTER — Telehealth: Payer: Self-pay | Admitting: Cardiology

## 2022-07-16 ENCOUNTER — Encounter: Payer: Self-pay | Admitting: Cardiology

## 2022-07-16 ENCOUNTER — Ambulatory Visit: Payer: BC Managed Care – PPO | Attending: Cardiology | Admitting: Cardiology

## 2022-07-16 VITALS — BP 94/58 | HR 78 | Ht 61.0 in | Wt 138.2 lb

## 2022-07-16 DIAGNOSIS — I951 Orthostatic hypotension: Secondary | ICD-10-CM | POA: Diagnosis not present

## 2022-07-16 MED ORDER — FLUDROCORTISONE ACETATE 0.1 MG PO TABS
ORAL_TABLET | ORAL | 1 refills | Status: DC
Start: 2022-07-16 — End: 2022-09-03

## 2022-07-16 NOTE — Telephone Encounter (Signed)
  Patient Consent for Virtual Visit        Lydia Jennings has provided verbal consent on 07/16/2022 for a virtual visit (video or telephone).   CONSENT FOR VIRTUAL VISIT FOR:  Lydia Jennings  By participating in this virtual visit I agree to the following:  I hereby voluntarily request, consent and authorize [facility] and its employed or contracted physicians, physician assistants, nurse practitioners or other licensed health care professionals (the Practitioner), to provide me with telemedicine health care services (the "Services") as deemed necessary by the treating Practitioner. I acknowledge and consent to receive the Services by the Practitioner via telemedicine. I understand that the telemedicine visit will involve communicating with the Practitioner through live audiovisual communication technology and the disclosure of certain medical information by electronic transmission. I acknowledge that I have been given the opportunity to request an in-person assessment or other available alternative prior to the telemedicine visit and am voluntarily participating in the telemedicine visit.  I understand that I have the right to withhold or withdraw my consent to the use of telemedicine in the course of my care at any time, without affecting my right to future care or treatment, and that the Practitioner or I may terminate the telemedicine visit at any time. I understand that I have the right to inspect all information obtained and/or recorded in the course of the telemedicine visit and may receive copies of available information for a reasonable fee.  I understand that some of the potential risks of receiving the Services via telemedicine include:  Delay or interruption in medical evaluation due to technological equipment failure or disruption; Information transmitted may not be sufficient (e.g. poor resolution of images) to allow for appropriate medical decision making by  the Practitioner; and/or  In rare instances, security protocols could fail, causing a breach of personal health information.  Furthermore, I acknowledge that it is my responsibility to provide information about my medical history, conditions and care that is complete and accurate to the best of my ability. I acknowledge that Practitioner's advice, recommendations, and/or decision may be based on factors not within their control, such as incomplete or inaccurate data provided by me or distortions of diagnostic images or specimens that may result from electronic transmissions. I understand that the practice of medicine is not an exact science and that Practitioner makes no warranties or guarantees regarding treatment outcomes. I acknowledge that a copy of this consent can be made available to me via my patient portal [facility] MyChart), or I can request a printed copy by calling the office of [facility].    I understand that my insurance will be billed for this visit.   I have read or had this consent read to me. I understand the contents of this consent, which adequately explains the benefits and risks of the Services being provided via telemedicine.  I have been provided ample opportunity to ask questions regarding this consent and the Services and have had my questions answered to my satisfaction. I give my informed consent for the services to be provided through the use of telemedicine in my medical care

## 2022-07-16 NOTE — Progress Notes (Signed)
Primary Care Provider: Herb Grays, MD [facility] cardiologist: None Electrophysiologist: None  Clinic Note: Chief Complaint  Patient presents with   Follow-up    Discussing syncopal episodes   Loss of Consciousness    At least once a week with otherwise intermittent near syncope.    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  She did not tolerate the higher dose of midodrine which was what I was hoping would benefit her.  Thankfully she does seem to stabilized out a little bit on the once daily midodrine.  I do think there may be some component of the SSRIs being involved but she clearly needs to be on it.  She is definitely staying adequately hydrated and even taking salt tablets.  Short of using support hose and abdominal binder as we are somewhat limited.  We will give a trial run of Florinef starting 0.1 mg tablets-start with taking 1/2 tablet for a week and then tolerating up to full tablet if tolerated.  We will discuss with my noninvasive colleagues who have more experience treating these types of conditions to see if there is any other options going forward      Relevant Orders   EKG 12-Lead    ===================================  HPI:    Lydia Jennings is a 21 y.o. female with a PMH notable for Syncope Due To Orthostatic Hypotension And Palpitations/Tachycardia who presents today for 1 month f/u   Lydia Jennings was last seen on May 31, 2022 => she apparently had another episode of syncope during church the previous week.  Started feeling little unusual and then had a shaking sensation and passed out briefly.  She was doing a little bit better with a midodrine but just felt somewhat strange with it but she noted that her blood pressure was little  higher than it had been.  Still having some heart racing episodes but no further syncope other than about 1.  Usually able to head them off by sitting down taking easy breaths units of the drink and eat.  Usually able to recover faster with hydration.  Issues are compounded by the fact that she was being weaned off Lexapro-switched to Effexor and trazodone. Plan on having her take midodrine 2.5 mg in the morning and at  Recent Hospitalizations: None  Reviewed  CV studies:    The following studies were reviewed today: (if available, images/films reviewed: From Epic Chart or Care Everywhere) No new studies:  Interval History:   Lydia Jennings returns here today for follow-up indicating that she is still having symptoms episodes maybe at least once a week with some of the near syncopal spells.  She is able to control the whole lot better in the 90s significant.  She seems to be the only losing consciousness for just a few seconds.  Usually in midmorning. Episodes are not always but can be associated with tachycardia but not necessarily irregular rhythm.    She does have baseline low blood pressure and we tried midodrine.  She tried 1 week without midodrine and said that she had a spell just about every day.  She then went back on once a day 2.5 mg midodrine and symptoms were relatively controlled.  When she went up to 2 tablets a day she felt like she was putting on  weight and noting bloating sensation.  She therefore went back to just once a day.  She is drinking what seems like at least 80 to 90 mL of water a day and is even taking salt pills. She does note that the symptoms seem to be worse during hot days because one of the sensations he notes is feeling hot and flushed before as a prodrome.  She is hoping that as the weather gets cooler the symptoms will improve.  She has converted from Lexapro to Effexor and then Effexor to Zoloft.  She said the Zoloft things seem to stabilize a little  bit she really did not tolerate the Effexor at all.  She seems to be doing better from the anxiety standpoint.  CV Review of Symptoms (Summary): no chest pain or dyspnea on exertion positive for - loss of consciousness, palpitations, rapid heart rate, and near syncope negative for - edema, irregular heartbeat, orthopnea, paroxysmal nocturnal dyspnea, shortness of breath, or TIA or amaurosis fugax, claudication  REVIEWED OF SYSTEMS   Review of Systems  All other systems reviewed and are negative.  I have reviewed and (if needed) personally updated the patient's problem list, medications, allergies, past medical and surgical history, social and family history.   PAST MEDICAL HISTORY   Past Medical History:  Diagnosis Date   Anxiety    Environmental allergies    Hx of migraines    Recurrent syncope 07/2020   Evaluated, had an echocardiogram.  Did not do tilt table    PAST SURGICAL HISTORY   Past Surgical History:  Procedure Laterality Date   Pancreatic debridement     TRANSTHORACIC ECHOCARDIOGRAM  08/02/2020   Normal right and left ventricles.  Normal valves.  No ASD or VSD     There is no immunization history on file for this patient.  MEDICATIONS/ALLERGIES   Current Meds  Medication Sig   Azelastine-Fluticasone 137-50 MCG/ACT SUSP Place into the nose.   cetirizine (ZYRTEC) 10 MG tablet Take by mouth.   Cholecalciferol 25 MCG (1000 UT) tablet Take by mouth.   cyanocobalamin (,VITAMIN B-12,) 1000 MCG/ML injection Inject into the muscle. Every two weeks   Cyanocobalamin (B-12) 100 MCG TABS Take 100 mcg by mouth daily.   desogestrel-ethinyl estradiol (MIRCETTE) 0.15-0.02/0.01 MG (21/5) tablet Take 1 tablet by mouth daily.   EPINEPHrine 0.3 mg/0.3 mL IJ SOAJ injection Inject into the muscle as directed.   MAGNESIUM PO Take by mouth daily.   midodrine (PROAMATINE) 2.5 MG tablet Take 1 tablet (2.5 mg total) by mouth 2 (two) times daily with a meal.   Naproxen Sodium (ALEVE  PO) Take by mouth as needed (headaches).   Omega-3 Fatty Acids (FISH OIL PO) Take by mouth daily.   RIBOFLAVIN PO Take by mouth.   rizatriptan (MAXALT) 10 MG tablet Take by mouth as needed for migraine.   traZODone (DESYREL) 50 MG tablet Take 50 mg by mouth at bedtime.   venlafaxine XR (EFFEXOR-XR) 75 MG 24 hr capsule Take 75 mg by mouth every morning.    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   Hopes to go into premed.  Her father is  an ENT Surgeon in [place]   Usually relatively active.Alycia Patten -PE, EKG, labs   Wt Readings from Last 3 Encounters:  07/16/22 138 lb 3.2 oz (62.7 kg) (68 %, Z= 0.47)*  05/14/22 142 lb 6.4 oz (64.6 kg) (74 %, Z= 0.65)*  02/12/22 129 lb 12.8 oz (58.9 kg) (57 %, Z= 0.17)*   * Growth percentiles are based on CDC (Girls, 2-20 Years) data.    Physical Exam: BP (!) 94/58   Pulse 78   Ht 5\' 1"  (1.549 m)   Wt 138 lb 3.2 oz (62.7 kg)   SpO2 98%   BMI 26.11 kg/m  Physical Exam Vitals reviewed.  Constitutional:      General: She is not in acute distress.    Appearance: Normal appearance. She is normal weight. She is not ill-appearing (Healthy-appearing.  Well-groomed.) or toxic-appearing.  HENT:     Head: Normocephalic and atraumatic.  Cardiovascular:     Rate and Rhythm: Normal rate and regular rhythm.     Pulses: Normal pulses.     Heart sounds: Normal heart sounds. No murmur heard.    No friction rub. No gallop.  Pulmonary:     Effort: Pulmonary effort is normal. No respiratory distress.     Breath sounds: Normal breath sounds. No wheezing.  Musculoskeletal:        General: No swelling. Normal range of motion.     Cervical back:  Normal range of motion.  Skin:    General: Skin is warm and dry.  Neurological:     General: No focal deficit present.     Mental Status: She is alert and oriented to person, place, and time.  Psychiatric:        Mood and Affect: Mood normal.        Behavior: Behavior normal.        Thought Content: Thought content normal.        Judgment: Judgment normal.     Adult ECG Report  Rate: 78;  Rhythm: normal sinus rhythm and nonspecific ST and T wave changes. ;   Narrative Interpretation: Stable  Recent Labs:   No new labs No results found for: "CHOL", "HDL", "LDLCALC", "LDLDIRECT", "TRIG", "CHOLHDL" Lab Results  Component Value Date   CREATININE 0.64 03/11/2016   BUN 12 03/11/2016   NA 137 03/11/2016   K 3.7 03/11/2016   CL 102 03/11/2016   CO2 26 03/11/2016      Latest Ref Rng & Units 03/11/2016    3:54 PM 08/02/2011    6:55 PM  CBC  WBC 3.6 - 11.0 K/uL 8.3  21.1   Hemoglobin 12.0 - 16.0 g/dL 12.9  13.2   Hematocrit 35.0 - 45.0 % 37.5  37.3   Platelets 150 - 440 K/uL 301  349     No results found for: "HGBA1C" No results found for: "TSH"  ================================================== I spent a total of 27 minutes with the patient spent in direct patient consultation.  Additional time spent with chart review  / charting (studies, outside notes, etc): 16 min Total Time: 43 min  Current medicines are reviewed at length with the patient today.  (+/- concerns) none  Notice: This dictation was prepared with Dragon dictation along with smart phrase technology. Any transcriptional errors that result from this process are unintentional and may not be corrected upon review.  Studies Ordered:  Orders Placed This Encounter  Procedures   EKG 12-Lead   Meds ordered this encounter  Medications  fludrocortisone (FLORINEF) 0.1 MG tablet    Sig: Take half tablet by mouth for one week, if tolerated increase to whole tablet after the first week.    Dispense:  30 tablet     Refill:  1    Patient Instructions / Medication Changes & Studies & Tests Ordered   Patient Instructions  Medication Instructions:   Your physician has recommended you make the following change in your medication:   START (FLORINEF) 0.1 MG tablet - Take half tablet by mouth for one week, if tolerated increase to whole tablet after the first week.  *If you need a refill on your cardiac medications before your next appointment, please call your pharmacy*   Lab Work:  None Ordered  If you have labs (blood work) drawn today and your tests are completely normal, you will receive your results only by: [place] (if you have MyChart) OR A paper copy in the mail If you have any lab test that is abnormal or we need to change your treatment, we will call you to review the results.   Testing/Procedures:  None Ordered   Follow-Up: At [facility], you and your health needs are our priority.  As part of our continuing mission to provide you with exceptional heart care, we have created designated Provider Care Teams.  These Care Teams include your primary Cardiologist (physician) and Advanced Practice Providers (APPs -  Physician Assistants and Nurse Practitioners) who all work together to provide you with the care you need, when you need it.  We recommend signing up for the patient portal called "MyChart".  Sign up information is provided on this After Visit Summary.  MyChart is used to connect with patients for Virtual Visits (Telemedicine).  Patients are able to view lab/test results, encounter notes, upcoming appointments, etc.  Non-urgent messages can be sent to your provider as well.   To learn more about what you can do with MyChart, go to [email or web address].    Your next appointment:   6-8  week(s)  The format for your next appointment:   Virtual Visit   Provider:   You may see Glenetta Hew, MD or one of the following Advanced Practice Providers on your  designated Care Team:   Murray Hodgkins, NP Christell Faith, PA-C Cadence Kathlen Mody, PA-C Gerrie Nordmann, NP   Important Information About [facility]             Leonie Man, MD, MS Glenetta Hew, M.D., M.S. Interventional Cardiologist  [facility]   [address] [place], [place]  [address] [phone]           Fax [phone]    Thank you for choosing [facility] in [place]!!

## 2022-07-16 NOTE — Patient Instructions (Signed)
Medication Instructions:   Your physician has recommended you make the following change in your medication:   START (FLORINEF) 0.1 MG tablet - Take half tablet by mouth for one week, if tolerated increase to whole tablet after the first week.  *If you need a refill on your cardiac medications before your next appointment, please call your pharmacy*   Lab Work:  None Ordered  If you have labs (blood work) drawn today and your tests are completely normal, you will receive your results only by: MyChart Message (if you have MyChart) OR A paper copy in the mail If you have any lab test that is abnormal or we need to change your treatment, we will call you to review the results.   Testing/Procedures:  None Ordered   Follow-Up: At [facility], you and your health needs are our priority.  As part of our continuing mission to provide you with exceptional heart care, we have created designated Provider Care Teams.  These Care Teams include your primary Cardiologist (physician) and Advanced Practice Providers (APPs -  Physician Assistants and Nurse Practitioners) who all work together to provide you with the care you need, when you need it.  We recommend signing up for the patient portal called "MyChart".  Sign up information is provided on this After Visit Summary.  MyChart is used to connect with patients for Virtual Visits (Telemedicine).  Patients are able to view lab/test results, encounter notes, upcoming appointments, etc.  Non-urgent messages can be sent to your provider as well.   To learn more about what you can do with MyChart, go to [email or web address].    Your next appointment:   6-8  week(s)  The format for your next appointment:   Virtual Visit   Provider:   You may see Bryan Lemma, MD or one of the following Advanced Practice Providers on your designated Care Team:   Nicolasa Ducking, NP Eula Listen, PA-C Cadence Fransico Michael, PA-C Charlsie Quest,  NP   Important Information About Sugar

## 2022-07-17 ENCOUNTER — Encounter: Payer: Self-pay | Admitting: Cardiology

## 2022-07-17 MED ORDER — MIDODRINE HCL 2.5 MG PO TABS: 2.5000 mg | ORAL_TABLET | Freq: Two times a day (BID) | ORAL | 3 refills | Status: AC

## 2022-07-17 NOTE — Assessment & Plan Note (Signed)
Very difficult scenario recurs her baseline blood pressure still low.  She did not tolerate the higher dose of midodrine which was what I was hoping would benefit her.  Thankfully she does seem to stabilized out a little bit on the once daily midodrine.  I do think there may be some component of the SSRIs being involved but she clearly needs to be on it.  She is definitely staying adequately hydrated and even taking salt tablets.  Short of using support hose and abdominal binder as we are somewhat limited.  We will give a trial run of Florinef starting 0.1 mg tablets-start with taking 1/2 tablet for a week and then tolerating up to full tablet if tolerated.  We will discuss with my noninvasive colleagues who have more experience treating these types of conditions to see if there is any other options going forward

## 2022-09-03 ENCOUNTER — Encounter: Payer: Self-pay | Admitting: Cardiology

## 2022-09-03 ENCOUNTER — Ambulatory Visit: Payer: BC Managed Care – PPO | Attending: Cardiology | Admitting: Cardiology

## 2022-09-03 DIAGNOSIS — I951 Orthostatic hypotension: Secondary | ICD-10-CM | POA: Diagnosis not present

## 2022-09-03 NOTE — Patient Instructions (Signed)
Medication Instructions:  Stop fluorinef Continue midodrine as you are taking  Do 2nd packet of Liquid IV per day  *If you need a refill on your cardiac medications before your next appointment, please call your pharmacy*   Lab Work: None  If you have labs (blood work) drawn today and your tests are completely normal, you will receive your results only by: [place] (if you have MyChart) OR A paper copy in the mail If you have any lab test that is abnormal or we need to change your treatment, we will call you to review the results.   Testing/Procedures: none   Follow-Up: At [facility], you and your health needs are our priority.  As part of our continuing mission to provide you with exceptional heart care, we have created designated Provider Care Teams.  These Care Teams include your primary Cardiologist (physician) and Advanced Practice Providers (APPs -  Physician Assistants and Nurse Practitioners) who all work together to provide you with the care you need, when you need it.  We recommend signing up for the patient portal called "MyChart".  Sign up information is provided on this After Visit Summary.  MyChart is used to connect with patients for Virtual Visits (Telemedicine).  Patients are able to view lab/test results, encounter notes, upcoming appointments, etc.  Non-urgent messages can be sent to your provider as well.   To learn more about what you can do with MyChart, go to [email or web address].    Your next appointment:   6 month(s)  The format for your next appointment:   Virtual Visit   Provider:   Glenetta Hew, MD    Other Instructions   Postural Orthostatic Tachycardia Syndrome: Spent >20 minutes counseling specifically on the etiology, diagnosis, and symptom management of POTS. The following recommendations were emphasized:  -avoid dehydration. Often it requires high volumes of fluids, often with salt/electrolytes included, to stay  hydrated. People with POTS are very sensitive to fluid shifts and dehydration. Oral rehydration is preferred, and routine use of IV fluids is not recommended.  -if tolerated, compression stocking can assist with fluid management and prevent pooling in the legs.  -slow position changes are recommended  -if there is a feeling of severe lightheadedness, like near to passing out, recommend lying on the floor on the back, with legs elevated up on a chair or up against the wall.  -the best long term management of POTS symptoms is gradual exercise conditioning. I recommend seated exercises such as bike to start, to avoid the risk of falling with lightheadedness. Exercise programs, either through supervised programs like cardiac rehab or through personal programs, should focus on gradually increasing exercise tolerance and conditioning.   -this is a link to specific exercise recommendations for POTS:   http://www.smith-bell.org/  -we discussed the typical spectrum of dysautonomia, including typical populations, that this sometimes spontaneously improves with age (though a small percentage have persistent symptoms), that this has uncomfortable symptoms but is not associated with long term mortality, and that the etiology/treatment of this is an area of active research

## 2022-09-03 NOTE — Progress Notes (Addendum)
Virtual Visit via Video Note   Because of Lydia Jennings's co-morbid illnesses, she is at least at moderate risk for complications without adequate follow up.  This format is felt to be most appropriate for this patient at this time.  All issues noted in this document were discussed and addressed.  A limited physical exam was performed with this format.  Please refer to the patient's chart for her consent to telehealth for Lydia Jennings.  We were able to do video conferencing via the standard program, but her microphone did not work, therefore we talked via telephone.  With the Audio portion remedied, the visit was completed via Video conferencing.   Patient has given verbal permission to conduct this visit via virtual appointment and to bill insurance 09/05/2022 12:57 AM     Evaluation Performed:  Follow-up visit  Date:  09/05/2022   ID:  Lydia Jennings, DOB 2001-05-17, MRN [id number]  Patient Location: Other:  [employer] Provider Location: Office/Clinic  PCP:  Lydia Grays, MD  Cardiologist:  None Lydia Lemma, MD  Electrophysiologist:  None   Chief Complaint:   Chief Complaint  Patient presents with   6-8 week follow up     Patient c/o syncope at least once weekly. Medications reviewed by the patient verbally.    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  She did not tolerate Florinef so we will stop that.  She is back on the lower dose to midodrine and I think may be a PRN dose of midodrine here and there for worsening symptoms will be reasonable.  Continue to hydrate including salt tablets.  Even potentially a second or third liquid IV during the course of the day.  I provided instructions and we discussed the recommendations from Dr. Janne Jennings, 1 my  colleagues to has an interest in this field.  These are listed in the instructions segment.  We can consider compression stockings.  We talked about some of the exercises and foot elevation.  I would potentially consider referral to a cardiologist there at [facility] to see if there is anyone with a specific interest in this field.       ====================================  History of Present Illness:    Lydia Jennings is a 21 y.o. female with PMH notable for pretty significant POTS with frequent syncope who presents via audio/video conferencing for a telehealth visit today as a 25-month follow-up.  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  Therefore we chose to assess symptoms via telehealth conferencing as clinical exam was not as necessary.  Lydia Jennings was last seen on September 7.  She was still having pretty significant syncopal episodes with heart racing and dizziness.  However they are still a little bit better than before.  Since she was still symptomatic we decided to give a trial run of Florinef in conjunction with midodrine.  I also contacted Dr. Janne Jennings for assistance with recommendations on treatment.  These recommendations are noted in the AVS section here today.    Hospitalizations:  None   Recent - Interim CV studies:   The following studies were reviewed today: None:  Inerval History   Lydia Jennings is being evaluated today via  telemedicine just to discuss her symptoms.  She says overall she feels better, but is still having at least 1 cold episode where she bit passes out a day.  Thankfully, this has not happened during class.  He usually is after class.  She is doing the best she can to hydrate drinking at least 6 bottles of water a day one of them with liquid IV.  She says the spells are not a significant, and is not having as much of the near syncopal spells in  between.  She did not tolerate the Florinef because she just got bloated in the hands and feet as well as abdomen.  She decided to stop that.  She is currently only taking the 2.5 mg of midodrine because she felt bad when she took the second dose.  She is actually working out now doing Pilates and other exercises which seem to help and she is not getting dizzy with these activities.  She is hoping that the change in weather will help.  Although she feels her heart rate going fast, she is not necessarily noticing significant tachycardia spells like she had before.  Is just that her resting heart rate is pretty fast.  Cardiovascular ROS: positive for - loss of consciousness, palpitations, and rapid heart rate negative for - chest pain, dyspnea on exertion, orthopnea, paroxysmal nocturnal dyspnea, shortness of breath, or TIA or amaurosis fugax, claudication; rapid irregular heartbeats/rhythms.   ROS:  Please see the history of present illness.     Review of Systems  Constitutional:  Positive for malaise/fatigue (She is worn out with her syncopal episodes.).  Neurological:  Positive for dizziness, loss of consciousness and headaches (More frequent migraines).       Discussed in HPI  Psychiatric/Behavioral:         She seems to be stabilized now on the Lydia Jennings, but still has significant depression and anxiety  All other systems reviewed and are negative.   Past Medical History:  Diagnosis Date   Anxiety    Environmental allergies    Hx of migraines    Recurrent syncope 07/2020   Evaluated, had an echocardiogram.  Did not do tilt table   Past Surgical History:  Procedure Laterality Date   Pancreatic debridement     TRANSTHORACIC ECHOCARDIOGRAM  08/02/2020   Normal right and left ventricles.  Normal valves.  No ASD or VSD     Current Meds  Medication Sig   Azelastine-Fluticasone 137-50 MCG/ACT SUSP Place into the nose.   cetirizine (ZYRTEC) 10 MG tablet Take by mouth.    Cholecalciferol 25 MCG (1000 UT) tablet Take by mouth.   cyanocobalamin (,VITAMIN B-12,) 1000 MCG/ML injection Inject into the muscle. Every two weeks   Cyanocobalamin (B-12) 100 MCG TABS Take 100 mcg by mouth daily.   desogestrel-ethinyl estradiol (MIRCETTE) 0.15-0.02/0.01 MG (21/5) tablet Take 1 tablet by mouth daily.   MAGNESIUM PO Take by mouth daily.   midodrine (PROAMATINE) 2.5 MG tablet Take 1 tablet (2.5 mg total) by mouth 2 (two) times daily with a meal. Take 1-2 tab daily as directed   Naproxen Sodium (ALEVE PO) Take by mouth as needed (headaches).   Omega-3 Fatty Acids (FISH OIL PO) Take by mouth daily.   RIBOFLAVIN PO Take by mouth.   rizatriptan (MAXALT) 10 MG tablet Take by mouth as needed for migraine.   sertraline (Lydia Jennings) 50 MG tablet Take 50 mg by mouth every morning.   traZODone (DESYREL) 50 MG tablet Take 50 mg by  mouth at bedtime.   [DISCONTINUED] fludrocortisone (FLORINEF) 0.1 MG tablet Take half tablet by mouth for one week, if tolerated increase to whole tablet after the first week.  => Patient did not tolerate, stopped taking it.     Allergies:   Amoxicillin   Social History   Tobacco Use   Smoking status: Never  Vaping Use   Vaping Use: Never used  Substance Use Topics   Alcohol use: No   Drug use: No     Family Hx: The patient's family history includes Brain cancer in her maternal grandmother; Cancer in her paternal grandmother; Migraines in her mother.   Labs/Other Tests and Data Reviewed:    EKG:  No ECG reviewed.  Recent Labs: No results found for requested labs within last 365 days.   Recent Lipid Panel No results found for: "CHOL", "TRIG", "HDL", "CHOLHDL", "LDLCALC", "LDLDIRECT"  Wt Readings from Last 3 Encounters:  09/03/22 144 lb (65.3 kg) (75 %, Z= 0.67)*  07/16/22 138 lb 3.2 oz (62.7 kg) (68 %, Z= 0.47)*  05/14/22 142 lb 6.4 oz (64.6 kg) (74 %, Z= 0.65)*   * Growth percentiles are based on CDC (Girls, 2-20 Years) data.      Objective:    Vital Signs:  BP 92/69 (BP Location: Right Arm, Patient Position: Sitting, Cuff Size: Normal)   Pulse 75   Ht 5\' 1"  (1.549 m)   Wt 144 lb (65.3 kg)   BMI 27.21 kg/m   VITAL SIGNS:  reviewed GEN:  no acute distress EYES:  sclerae anicteric, EOMI - Extraocular Movements Intact RESPIRATORY:  normal respiratory effort, symmetric expansion NEURO:  alert and oriented x 3, no obvious focal deficit PSYCH:  normal affect  ==========================================  COVID-19 Education: The signs and symptoms of COVID-19 were discussed with the patient and how to seek care for testing (follow up with PCP or arrange E-visit).   The importance of social distancing was discussed today.  Time:   Today, I have spent 23 minutes with the patient with telehealth technology discussing the above problems.   An additional 12 minutes spent charting (reviewing prior notes, hospital records, studies, labs etc.) Total 35 minutes   Medication Adjustments/Labs and Tests Ordered: Current medicines are reviewed at length with the patient today.  Concerns regarding medicines are outlined above.   Patient Instructions  Medication Instructions:  Stop fluorinef Continue midodrine as you are taking  Do 2nd packet of Liquid IV per day  *If you need a refill on your cardiac medications before your next appointment, please call your pharmacy*   Lab Work: None  If you have labs (blood work) drawn today and your tests are completely normal, you will receive your results only by: MyChart Message (if you have MyChart) OR A paper copy in the mail If you have any lab test that is abnormal or we need to change your treatment, we will call you to review the results.   Testing/Procedures: none   Follow-Up: At [facility], you and your health needs are our priority.  As part of our continuing mission to provide you with exceptional heart care, we have created designated Provider  Care Teams.  These Care Teams include your primary Cardiologist (physician) and Advanced Practice Providers (APPs -  Physician Assistants and Nurse Practitioners) who all work together to provide you with the care you need, when you need it.  We recommend signing up for the patient portal called "MyChart".  Sign up information is provided on  this After Visit Summary.  MyChart is used to connect with patients for Virtual Visits (Telemedicine).  Patients are able to view lab/test results, encounter notes, upcoming appointments, etc.  Non-urgent messages can be sent to your provider as well.   To learn more about what you can do with MyChart, go to [email or web address].    Your next appointment:   6 month(s)  The format for your next appointment:   Virtual Visit   Provider:   Bryan Lemma, MD    Other Instructions   Postural Orthostatic Tachycardia Syndrome: Spent >20 minutes counseling specifically on the etiology, diagnosis, and symptom management of POTS. The following recommendations were emphasized:  -avoid dehydration. Often it requires high volumes of fluids, often with salt/electrolytes included, to stay hydrated. People with POTS are very sensitive to fluid shifts and dehydration. Oral rehydration is preferred, and routine use of IV fluids is not recommended.  -if tolerated, compression stocking can assist with fluid management and prevent pooling in the legs.  -slow position changes are recommended  -if there is a feeling of severe lightheadedness, like near to passing out, recommend lying on the floor on the back, with legs elevated up on a chair or up against the wall.  -the best long term management of POTS symptoms is gradual exercise conditioning. I recommend seated exercises such as bike to start, to avoid the risk of falling with lightheadedness. Exercise programs, either through supervised programs like cardiac rehab or through personal programs, should focus on gradually  increasing exercise tolerance and conditioning.   -this is a link to specific exercise recommendations for POTS:   http://peterson-powell.net/  -we discussed the typical spectrum of dysautonomia, including typical populations, that this sometimes spontaneously improves with age (though a small percentage have persistent symptoms), that this has uncomfortable symptoms but is not associated with long term mortality, and that the etiology/treatment of this is an area of active research        Signed, Lydia Lemma, MD  09/05/2022 12:57 AM    [facility]

## 2022-09-05 ENCOUNTER — Encounter: Payer: Self-pay | Admitting: Cardiology

## 2022-09-05 NOTE — Assessment & Plan Note (Signed)
Unfortunately, we really have not improved her all that much.  She did not tolerate Florinef so we will stop that.  She is back on the lower dose to midodrine and I think may be a PRN dose of midodrine here and there for worsening symptoms will be reasonable.  Continue to hydrate including salt tablets.  Even potentially a second or third liquid IV during the course of the day.  I provided instructions and we discussed the recommendations from Dr. Al Pimple, 1 my colleagues to has an interest in this field.  These are listed in the instructions segment.  We can consider compression stockings.  We talked about some of the exercises and foot elevation.  I would potentially consider referral to a cardiologist there at [facility] to see if there is anyone with a specific interest in this field.

## 2023-05-11 ENCOUNTER — Other Ambulatory Visit: Payer: Self-pay | Admitting: Otolaryngology

## 2023-05-11 DIAGNOSIS — M79602 Pain in left arm: Secondary | ICD-10-CM

## 2023-05-12 ENCOUNTER — Other Ambulatory Visit: Payer: Self-pay | Admitting: Otolaryngology

## 2023-05-12 ENCOUNTER — Other Ambulatory Visit (INDEPENDENT_AMBULATORY_CARE_PROVIDER_SITE_OTHER): Payer: Self-pay | Admitting: Vascular Surgery

## 2023-05-12 ENCOUNTER — Ambulatory Visit
Admission: RE | Admit: 2023-05-12 | Discharge: 2023-05-12 | Disposition: A | Discharge status: Home / Self Care | Payer: BC Managed Care – PPO | Source: Ambulatory Visit | Attending: Otolaryngology | Admitting: Otolaryngology

## 2023-05-12 DIAGNOSIS — M79602 Pain in left arm: Secondary | ICD-10-CM

## 2023-05-12 DIAGNOSIS — I808 Phlebitis and thrombophlebitis of other sites: Secondary | ICD-10-CM

## 2023-05-14 ENCOUNTER — Other Ambulatory Visit: Payer: Self-pay | Admitting: Otolaryngology

## 2023-05-14 DIAGNOSIS — I82622 Acute embolism and thrombosis of deep veins of left upper extremity: Secondary | ICD-10-CM

## 2023-05-14 DIAGNOSIS — M79602 Pain in left arm: Secondary | ICD-10-CM

## 2023-05-17 ENCOUNTER — Ambulatory Visit (INDEPENDENT_AMBULATORY_CARE_PROVIDER_SITE_OTHER): Payer: BC Managed Care – PPO

## 2023-05-17 DIAGNOSIS — I808 Phlebitis and thrombophlebitis of other sites: Secondary | ICD-10-CM

## 2023-05-18 ENCOUNTER — Encounter: Payer: Self-pay | Admitting: Cardiology

## 2023-05-19 ENCOUNTER — Other Ambulatory Visit: Payer: Self-pay

## 2023-09-30 ENCOUNTER — Ambulatory Visit: Payer: BC Managed Care – PPO | Admitting: Cardiology

## 2024-03-28 ENCOUNTER — Encounter (INDEPENDENT_AMBULATORY_CARE_PROVIDER_SITE_OTHER): Payer: Self-pay
